# Patient Record
Sex: Male | Born: 1970 | Race: White | Hispanic: No | Marital: Married
Health system: Southern US, Community
[De-identification: ages and names within clinical notes are randomized; demographics above are authoritative.]

---

## 2006-08-12 ENCOUNTER — Ambulatory Visit: Payer: Self-pay

## 2010-05-03 ENCOUNTER — Emergency Department: Payer: Self-pay | Admitting: Internal Medicine

## 2012-06-23 ENCOUNTER — Emergency Department: Payer: Self-pay | Admitting: Emergency Medicine

## 2012-06-23 LAB — BASIC METABOLIC PANEL
Anion Gap: 10 (ref 7–16)
BUN: 10 mg/dL (ref 7–18)
Chloride: 104 mmol/L (ref 98–107)
Creatinine: 0.89 mg/dL (ref 0.60–1.30)
EGFR (Non-African Amer.): >60
Glucose: 238 mg/dL — ABNORMAL HIGH (ref 65–99)
Potassium: 3.5 mmol/L (ref 3.5–5.1)

## 2012-06-23 LAB — URINALYSIS, COMPLETE
Bacteria: NONE SEEN
Glucose,UR: 50 mg/dL (ref 0–75)
Hyaline Cast: 1
Leukocyte Esterase: NEGATIVE
Nitrite: NEGATIVE
Ph: 7 (ref 4.5–8.0)
RBC,UR: 10 /HPF (ref 0–5)
Specific Gravity: 1.008 (ref 1.003–1.030)

## 2012-06-23 LAB — CBC
HCT: 42 % (ref 40.0–52.0)
HGB: 15 g/dL (ref 13.0–18.0)
MCH: 29.9 pg (ref 26.0–34.0)
RBC: 5.03 10*6/uL (ref 4.40–5.90)
WBC: 8.7 10*3/uL (ref 3.8–10.6)

## 2012-07-14 ENCOUNTER — Emergency Department: Payer: Self-pay | Admitting: Emergency Medicine

## 2012-07-14 LAB — COMPREHENSIVE METABOLIC PANEL
Albumin: 4 g/dL (ref 3.4–5.0)
Alkaline Phosphatase: 71 U/L (ref 50–136)
BUN: 14 mg/dL (ref 7–18)
Bilirubin,Total: 0.4 mg/dL (ref 0.2–1.0)
Calcium, Total: 9 mg/dL (ref 8.5–10.1)
Glucose: 138 mg/dL — ABNORMAL HIGH (ref 65–99)
Osmolality: 288 (ref 275–301)
Potassium: 3.3 mmol/L — ABNORMAL LOW (ref 3.5–5.1)
SGOT(AST): 23 U/L (ref 15–37)
Sodium: 143 mmol/L (ref 136–145)

## 2012-07-14 LAB — LIPASE, BLOOD: Lipase: 113 U/L (ref 73–393)

## 2012-07-14 LAB — CBC
HGB: 14.6 g/dL (ref 13.0–18.0)
MCH: 29.2 pg (ref 26.0–34.0)
MCHC: 35.3 g/dL (ref 32.0–36.0)
MCV: 83 fL (ref 80–100)
Platelet: 175 10*3/uL (ref 150–440)
RBC: 4.99 10*6/uL (ref 4.40–5.90)
WBC: 7.3 10*3/uL (ref 3.8–10.6)

## 2012-07-14 LAB — URINALYSIS, COMPLETE
Bacteria: NONE SEEN
Bilirubin,UR: NEGATIVE
Glucose,UR: NEGATIVE mg/dL (ref 0–75)
Leukocyte Esterase: NEGATIVE
Protein: NEGATIVE
RBC,UR: 13 /HPF (ref 0–5)
Specific Gravity: 1.014 (ref 1.003–1.030)
Squamous Epithelial: <1
WBC UR: <1 /HPF (ref 0–5)

## 2014-08-03 ENCOUNTER — Emergency Department: Payer: Self-pay | Admitting: Emergency Medicine

## 2016-11-23 ENCOUNTER — Emergency Department: Payer: Self-pay

## 2016-11-23 ENCOUNTER — Emergency Department
Admission: EM | Admit: 2016-11-23 | Discharge: 2016-11-24 | Payer: Self-pay | Attending: Student in an Organized Health Care Education/Training Program | Admitting: Student in an Organized Health Care Education/Training Program

## 2016-11-23 DIAGNOSIS — J9601 Acute respiratory failure with hypoxia: Secondary | ICD-10-CM | POA: Insufficient documentation

## 2016-11-23 DIAGNOSIS — R8299 Other abnormal findings in urine: Secondary | ICD-10-CM | POA: Insufficient documentation

## 2016-11-23 DIAGNOSIS — I613 Nontraumatic intracerebral hemorrhage in brain stem: Secondary | ICD-10-CM | POA: Insufficient documentation

## 2016-11-23 LAB — URINALYSIS, COMPLETE (UACMP) WITH MICROSCOPIC
BILIRUBIN URINE: NEGATIVE
Glucose, UA: NEGATIVE mg/dL
Hgb urine dipstick: NEGATIVE
Ketones, ur: 5 mg/dL — AB
LEUKOCYTES UA: NEGATIVE
NITRITE: NEGATIVE
Protein, ur: >=300 mg/dL — AB
Specific Gravity, Urine: 1.022 (ref 1.005–1.030)
pH: 5 (ref 5.0–8.0)

## 2016-11-23 LAB — CBC WITH DIFFERENTIAL/PLATELET
BASOS ABS: 0.1 10*3/uL (ref 0–0.1)
BASOS PCT: 0 %
EOS ABS: 0.3 10*3/uL (ref 0–0.7)
Eosinophils Relative: 1 %
HCT: 48.9 % (ref 40.0–52.0)
HEMOGLOBIN: 16.4 g/dL (ref 13.0–18.0)
Lymphocytes Relative: 41 %
Lymphs Abs: 7.5 10*3/uL — ABNORMAL HIGH (ref 1.0–3.6)
MCH: 27.7 pg (ref 26.0–34.0)
MCHC: 33.5 g/dL (ref 32.0–36.0)
MCV: 82.7 fL (ref 80.0–100.0)
Monocytes Absolute: 1.7 10*3/uL — ABNORMAL HIGH (ref 0.2–1.0)
Monocytes Relative: 9 %
NEUTROS PCT: 49 %
Neutro Abs: 8.6 10*3/uL — ABNORMAL HIGH (ref 1.4–6.5)
Platelets: 192 10*3/uL (ref 150–440)
RBC: 5.92 MIL/uL — AB (ref 4.40–5.90)
RDW: 14.7 % — ABNORMAL HIGH (ref 11.5–14.5)
WBC: 18.1 10*3/uL — AB (ref 3.8–10.6)

## 2016-11-23 LAB — LACTIC ACID, PLASMA: Lactic Acid, Venous: 5.6 mmol/L (ref 0.5–1.9)

## 2016-11-23 LAB — GLUCOSE, CAPILLARY: Glucose-Capillary: 261 mg/dL — ABNORMAL HIGH (ref 65–99)

## 2016-11-23 MED ORDER — EPINEPHRINE PF 1 MG/10ML IJ SOSY
PREFILLED_SYRINGE | INTRAMUSCULAR | Status: DC | PRN
  Administered 2016-11-23 (×2): 1 mg via INTRAVENOUS

## 2016-11-23 MED ORDER — DEXTROSE 5 % IV SOLN: 2.0000 g | Freq: Once | INTRAVENOUS | Status: DC

## 2016-11-23 MED ORDER — FENTANYL 2500MCG IN NS 250ML (10MCG/ML) PREMIX INFUSION
40.0000 ug/h | INTRAVENOUS | Status: DC
  Administered 2016-11-23: 40 ug/h via INTRAVENOUS
  Filled 2016-11-23: qty 250

## 2016-11-23 MED ORDER — NOREPINEPHRINE BITARTRATE 1 MG/ML IV SOLN
0.0000 ug/min | Freq: Once | INTRAVENOUS | Status: AC
  Administered 2016-11-23: 25 ug/min via INTRAVENOUS
  Filled 2016-11-23: qty 4

## 2016-11-23 MED ORDER — MANNITOL 25 % IV SOLN
100.0000 g | Freq: Once | INTRAVENOUS | Status: AC
  Administered 2016-11-23: 100 g via INTRAVENOUS
  Filled 2016-11-23: qty 400

## 2016-11-23 MED ORDER — MAGNESIUM SULFATE 2 GM/50ML IV SOLN
INTRAVENOUS | Status: AC
  Administered 2016-11-23: 4 g via INTRAVENOUS
  Filled 2016-11-23: qty 50

## 2016-11-23 MED ORDER — NICARDIPINE HCL IN NACL 20-0.86 MG/200ML-% IV SOLN
0.0000 mg/h | INTRAVENOUS | Status: DC
  Administered 2016-11-23: 7.5 mg/h via INTRAVENOUS
  Administered 2016-11-23: 5 mg/h via INTRAVENOUS
  Filled 2016-11-23: qty 200

## 2016-11-23 MED ORDER — SODIUM BICARBONATE 8.4 % IV SOLN
50.0000 meq | Freq: Once | INTRAVENOUS | Status: AC
  Administered 2016-11-23: 50 meq via INTRAVENOUS

## 2016-11-23 MED ORDER — ACETAMINOPHEN 650 MG RE SUPP
650.0000 mg | Freq: Once | RECTAL | Status: AC
  Administered 2016-11-23: 650 mg via RECTAL

## 2016-11-23 MED ORDER — AMIODARONE IV BOLUS ONLY 150 MG/100ML
INTRAVENOUS | Status: AC
  Filled 2016-11-23: qty 100

## 2016-11-23 MED ORDER — CEFEPIME HCL 2 G IJ SOLR
2.0000 g | Freq: Once | INTRAMUSCULAR | Status: AC
  Administered 2016-11-23: 2 g via INTRAVENOUS
  Filled 2016-11-23: qty 2

## 2016-11-23 MED ORDER — ACETAMINOPHEN 650 MG RE SUPP
RECTAL | Status: AC
  Administered 2016-11-23: 650 mg via RECTAL
  Filled 2016-11-23: qty 1

## 2016-11-23 MED ORDER — MAGNESIUM SULFATE 4 GM/100ML IV SOLN
4.0000 g | Freq: Once | INTRAVENOUS | Status: AC
  Administered 2016-11-23: 4 g via INTRAVENOUS

## 2016-11-23 MED ORDER — SODIUM CHLORIDE 0.9 % IV SOLN
1500.0000 mg | Freq: Once | INTRAVENOUS | Status: AC
  Administered 2016-11-23: 1500 mg via INTRAVENOUS
  Filled 2016-11-23: qty 15

## 2016-11-23 MED ORDER — SODIUM CHLORIDE 0.9 % IV SOLN: 40.0000 ug/h | INTRAVENOUS | Status: DC

## 2016-11-23 MED ORDER — ETOMIDATE 2 MG/ML IV SOLN
20.0000 mg | Freq: Once | INTRAVENOUS | Status: AC
  Administered 2016-11-23: 20 mg via INTRAVENOUS

## 2016-11-23 MED ORDER — PROPOFOL 1000 MG/100ML IV EMUL
10.0000 ug/kg/min | Freq: Once | INTRAVENOUS | Status: DC
  Administered 2016-11-23: 10 ug/kg/min via INTRAVENOUS

## 2016-11-23 MED ORDER — SUCCINYLCHOLINE CHLORIDE 20 MG/ML IJ SOLN
100.0000 mg | Freq: Once | INTRAMUSCULAR | Status: AC
  Administered 2016-11-23: 100 mg via INTRAVENOUS

## 2016-11-23 NOTE — ED Notes (Signed)
CPR started on patient

## 2016-11-23 NOTE — ED Provider Notes (Signed)
[facility] Emergency Department Provider Note    First MD Initiated Contact with Patient 12/02/2016 1851     (approximate)  I have reviewed the triage vital signs and the nursing notes.   HISTORY  Chief Complaint Respiratory Distress  Level V Caveat:  unresponsive  HPI Bernard Patterson is a 46 y.o. male presents via EMS with agonal respirations after falling unresponsive while at work. According to coworkers patient was saying that he felt "sick" few moments prior to having syncopal episode. Found by EMS to be hypertensive to 260s. Pupils were pinpoint. No seizure episode upon initial evaluation. They tried 2 mg of Narcan without any improvement. Patient taken emergently via ambulance to the ER. In route patient did have a generalized tonic-clonic seizure lasting roughly 2 minutes. Treated with Versed. Patient arrives to the ER with agonal respirations. Unresponsive. Hypertensive with palpable pulse.Glucose normal.   No past medical history on file. No family history on file. No past surgical history on file. There are no active problems to display for this patient.     Prior to Admission medications   Not on File    Allergies Patient has no allergy information on record.    Social History Social History  Substance Use Topics  . Smoking status: Not on file  . Smokeless tobacco: Not on file  . Alcohol use Not on file    Review of Systems Unable to obtain due to unresponsiveness ____________________________________________   PHYSICAL EXAM:  VITAL SIGNS: Vitals:   11/27/2016 1948 12/06/2016 2001  BP: (!) 160/91 (!) 199/92  Pulse: 90 (!) 101  Resp: (!) 22 (!) 25  Temp: (!) 101 F (38.3 C) (!) 100.4 F (38 C)    Constitutional: agonal respirations, critically ill, bagging being performed by EMS Eyes: Conjunctivae are injected, pupils. EOMI. Head: Atraumatic. Nose: No congestion/rhinnorhea. Mouth/Throat: Mucous membranes are moist.   Oropharynx non-erythematous. Neck: No stridor. Cardiovascular: tachycardic, regular rhythm. Grossly normal heart sounds.  Good peripheral circulation. Respiratory: agonal respirations with coarse rhonchorus breathsounds Gastrointestinal: Soft and nontender. No distention. No abdominal bruits. No CVA tenderness. Musculoskeletal: No lower extremity tenderness nor edema.  No joint effusions. Neurologic:  GCS 3, .   ____________________________________________   LABS (all labs ordered are listed, but only abnormal results are displayed)  Results for orders placed or performed during the hospital encounter of 12/08/2016 (from the past 24 hour(s))  CBC WITH DIFFERENTIAL     Status: Abnormal   Collection Time: 11/19/2016  6:43 PM  Result Value Ref Range   WBC 18.1 (H) 3.8 - 10.6 K/uL   RBC 5.92 (H) 4.40 - 5.90 MIL/uL   Hemoglobin 16.4 13.0 - 18.0 g/dL   HCT 16.148.9 09.640.0 - 04.552.0 %   MCV 82.7 80.0 - 100.0 fL   MCH 27.7 26.0 - 34.0 pg   MCHC 33.5 32.0 - 36.0 g/dL   RDW 40.914.7 (H) 81.111.5 - 91.414.5 %   Platelets 192 150 - 440 K/uL   Neutrophils Relative % 49 %   Neutro Abs 8.6 (H) 1.4 - 6.5 K/uL   Lymphocytes Relative 41 %   Lymphs Abs 7.5 (H) 1.0 - 3.6 K/uL   Monocytes Relative 9 %   Monocytes Absolute 1.7 (H) 0.2 - 1.0 K/uL   Eosinophils Relative 1 %   Eosinophils Absolute 0.3 0 - 0.7 K/uL   Basophils Relative 0 %   Basophils Absolute 0.1 0 - 0.1 K/uL  Lactic acid, plasma     Status: Abnormal  Collection Time: 12/13/2016  7:31 PM  Result Value Ref Range   Lactic Acid, Venous 5.6 (HH) 0.5 - 1.9 mmol/L  Urinalysis, Complete w Microscopic     Status: Abnormal   Collection Time: 11/26/2016  7:31 PM  Result Value Ref Range   Color, Urine YELLOW (A) YELLOW   APPearance CLEAR (A) CLEAR   Specific Gravity, Urine 1.022 1.005 - 1.030   pH 5.0 5.0 - 8.0   Glucose, UA NEGATIVE NEGATIVE mg/dL   Hgb urine dipstick NEGATIVE NEGATIVE   Bilirubin Urine NEGATIVE NEGATIVE   Ketones, ur 5 (A) NEGATIVE mg/dL    Protein, ur >=811 (A) NEGATIVE mg/dL   Nitrite NEGATIVE NEGATIVE   Leukocytes, UA NEGATIVE NEGATIVE  Glucose, capillary     Status: Abnormal   Collection Time: 11/22/2016  9:13 PM  Result Value Ref Range   Glucose-Capillary 261 (H) 65 - 99 mg/dL   ____________________________________________  EKG My review and personal interpretation at Time: 18:42   Indication: ams  Rate: 130  Rhythm: sinus Axis: normal Other: diffuse inferolateral ST depressions with ST elevation in Avr> V1 with lateral t wave inversions ____________________________________________  RADIOLOGY  I personally reviewed all radiographic images ordered to evaluate for the above acute complaints and reviewed radiology reports and findings.  These findings were personally discussed with the patient.  Please see medical record for radiology report.  ____________________________________________   PROCEDURES  Procedure(s) performed:  Procedure Name: Intubation Date/Time: 12/06/2016 7:38 PM Performed by: Willy Eddy Pre-anesthesia Checklist: Patient identified, Emergency Drugs available and Suction available Oxygen Delivery Method: Simple face mask Preoxygenation: Pre-oxygenation with 100% oxygen Intubation Type: Rapid sequence Ventilation: Two handed mask ventilation required and Nasal airway inserted- appropriate to patient size Laryngoscope Size: Glidescope and 3 Grade View: Grade III Tube size: 7.5 mm Number of attempts: 1 Airway Equipment and Method: Stylet and Video-laryngoscopy Placement Confirmation: ETT inserted through vocal cords under direct vision,  Positive ETCO2,  CO2 detector and Breath sounds checked- equal and bilateral Secured at: 22 cm Tube secured with: ETT holder Dental Injury: Teeth and Oropharynx as per pre-operative assessment  Difficulty Due To: Difficulty was anticipated Future Recommendations: Recommend- induction with short-acting agent, and alternative techniques readily  available         Critical Care performed: yes CRITICAL CARE Performed by: Willy Eddy   Total critical care time: 75 minutes  Critical care time was exclusive of separately billable procedures and treating other patients.  Critical care was necessary to treat or prevent imminent or life-threatening deterioration.  Critical care was time spent personally by me on the following activities: development of treatment plan with patient and/or surrogate as well as nursing, discussions with consultants, evaluation of patient's response to treatment, examination of patient, obtaining history from patient or surrogate, ordering and performing treatments and interventions, ordering and review of laboratory studies, ordering and review of radiographic studies, pulse oximetry and re-evaluation of patient's condition.  ____________________________________________   INITIAL IMPRESSION / ASSESSMENT AND PLAN / ED COURSE  Pertinent labs & imaging results that were available during my care of the patient were reviewed by me and considered in my medical decision making (see chart for details).  DDX: sah, seizure, acs, electrolyte ab  Bernard Patterson is a 46 y.o. who presents to the ED with agonal respirations after a witnessed fall as described above. Patient tachycardic and hypertensive. Intubated due to agonal respirations with a GCS of 3. No purposeful movement. Differentials included seizure, head bleed, ACS. EKG does show  diffuse ST changes concerning for ACS but based on his medical neurologic this time I am concern for head bleed. Patient taken emergently to CT head.   CT imaging with evidence of acute brainstem hemorrhage with surrounding edema to explain the patient's presentation. Patient started on Cardene. We'll continue propofol and fentanyl. We'll start mannitol as well.  Have contacted [facility] for transfer for further evaluation and management. Patient remains in critical  condition. Clinical Course as of Nov 23 2241  Sat Dec 14, 2016  2005 Patient exhibited [facility]. Family updated at bedside. Patient currently stable remains critically ill. Blood pressure now at goal on Cardene and propofol.  IV mannitol infusing.    [PR]  2059 Patient began having seizure-like activity while being transferred to the bed from our [facility]. Patient was given IV Ativan. Patient then went into what appeared to be torsades. Subsequently had started on CPR as he went pulseless. Present defibrillated 1 time. 2 rounds of CPR performed with 1 mg of epi given in between. After that the patient did have return of spontaneous circulation. He is found to be hypotensive which point patient was started on IV magnesium as well as norepinephrine drip. Patient with critical condition and poor prognosis. Family updated.  [PR]  2105 PAtient now hypotensive.  Started on epi gtt.  [PR]  2133 An additional 30 minutes of CPR was performed with refractory PA film to respond to epinephrine magnesium calcium bicarbonate. At this point the patient's status remains critical and due to significant head bleed with evidence of probable catastrophic brain stem herniation. Patient family was updated on his report prognosis and resuscitative efforts were stopped at 9:25pm.  ME was contacted.  All questions were answered to the best of my ability.    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    Willy Eddy, MD December 14, 2016 2256

## 2016-11-23 NOTE — ED Notes (Signed)
Patient taken to CT.

## 2016-11-23 NOTE — ED Notes (Signed)
Family at bedside. 

## 2016-11-23 NOTE — ED Notes (Addendum)
Return of pulse noted with BP of 183/137 and pulse of 108.

## 2016-11-23 NOTE — ED Notes (Signed)
Patient entered v-fib

## 2016-11-23 NOTE — ED Notes (Signed)
[facility] at beside

## 2016-11-23 NOTE — ED Notes (Signed)
Pharmacy called for them to release meds

## 2016-11-23 NOTE — ED Notes (Addendum)
During patient transfer to [facility] bed, he started to jerk and vomit.  Patient was quickly suctioned and OG connected to suction to evacuate him.  Patient bradied down to 46 pulse during episode.  2mg  Ativan IV was given by [facility] RN into patient left forearm IV.  MD notified.

## 2016-11-23 NOTE — ED Notes (Signed)
Patient went back into v-fib and was shocked to correct arrythmia

## 2016-11-23 NOTE — ED Notes (Signed)
Staff at bedside:  Maralyn SagoSarah, RN; Onalee Huaavid, RN;  Waldo LaineKaitlin, RN; Theodoro Gristave, EMT; Imagene GurneyJann, EDT; Roxan Hockeyobinson, MD; Dora SimsSonjia, RN; Gearldine BienenstockBrandy, RN; Dorinda Hillonald, RN; Darl PikesSusan, RN

## 2016-11-23 NOTE — ED Notes (Signed)
Patient returned from CT

## 2016-11-23 NOTE — ED Notes (Signed)
Patient placed in bag with identification on the outside of the bag.

## 2016-11-23 NOTE — ED Notes (Signed)
Last set of [facility] VS:  115/79BP, 95P, 28R, et 43

## 2016-11-23 NOTE — ED Notes (Signed)
[facility] called to come pick up patient.

## 2016-11-23 NOTE — ED Notes (Signed)
Time of Death called

## 2016-11-23 NOTE — ED Notes (Addendum)
EMS arrives with patient and according to them the patient was at work and was reported to go down with co workers seeing him look ike he was clenching his head, EMS [facility].  Patient is non responsive and being bagged as of arrival to room 6.  EMS states they administered 4 mg Narcan to patient en route to [facility].

## 2016-11-23 NOTE — ED Notes (Signed)
Patient shocked by [facility] RN, citing Torsade rhythm

## 2016-11-25 LAB — URINE CULTURE: CULTURE: NO GROWTH

## 2016-11-28 LAB — CULTURE, BLOOD (ROUTINE X 2)
CULTURE: NO GROWTH
Culture: NO GROWTH
SPECIAL REQUESTS: ADEQUATE

## 2016-12-15 NOTE — ED Notes (Signed)
During intubation after tube was placed, a "piece french fry " was reported to be seen coming out of the tube by the placing MD.

## 2016-12-15 NOTE — ED Notes (Signed)
RT called to room in anticipated arrival of Respiratory Distress patient.

## 2016-12-15 DEATH — deceased

## 2018-11-21 IMAGING — CT CT HEAD W/O CM
3 series · 14 of 47 positions shown, 16 images · non-contrast
Comparison: None.

CLINICAL DATA: Patient found unresponsive.

EXAM:
CT HEAD WITHOUT CONTRAST
TECHNIQUE: Contiguous axial images were obtained from the base of the skull
through the vertex without intravenous contrast.

[Series 2: head wo · axial · 0.47mm/px · z∈[-163,-13]mm · 8 of 36 slices shown, 10 images]
[im 3/36  brain]
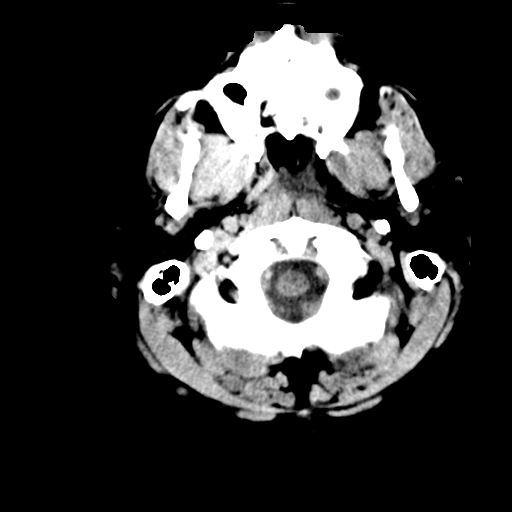
[im 3/36  bone]
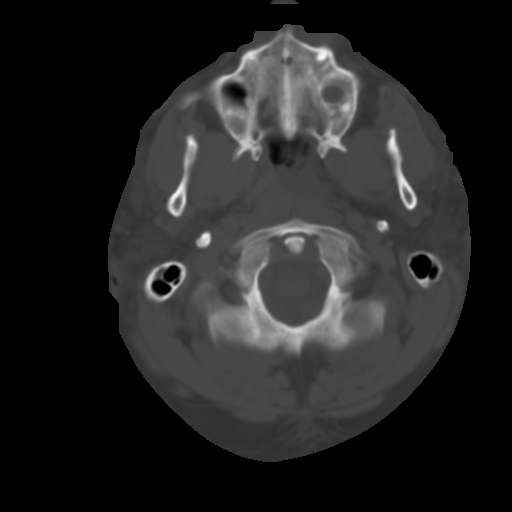
[im 8/36  brain]
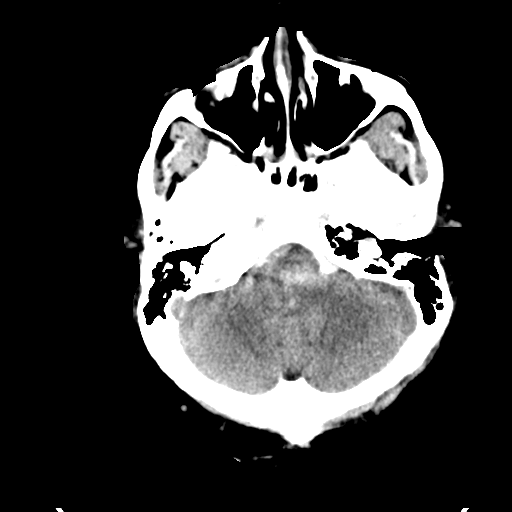
[im 11/36  brain]
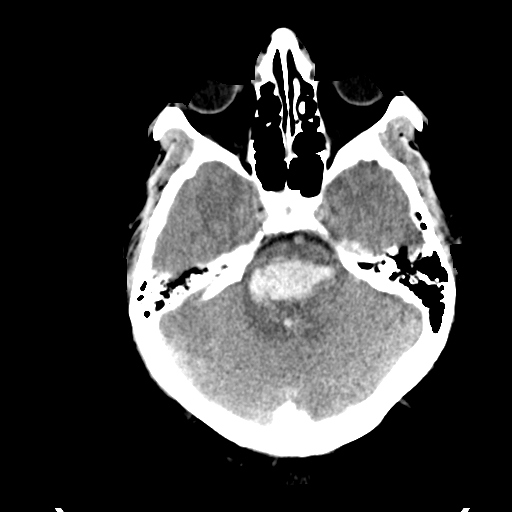
[im 16/36  brain]
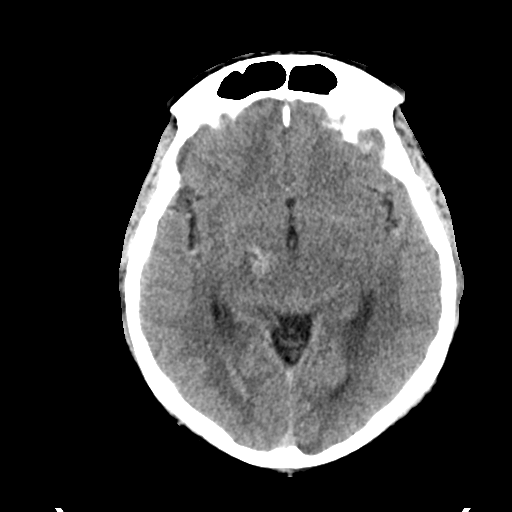
[im 20/36  brain]
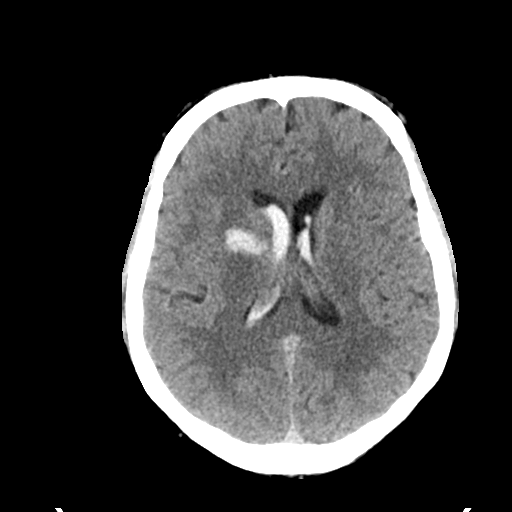
[im 20/36  bone]
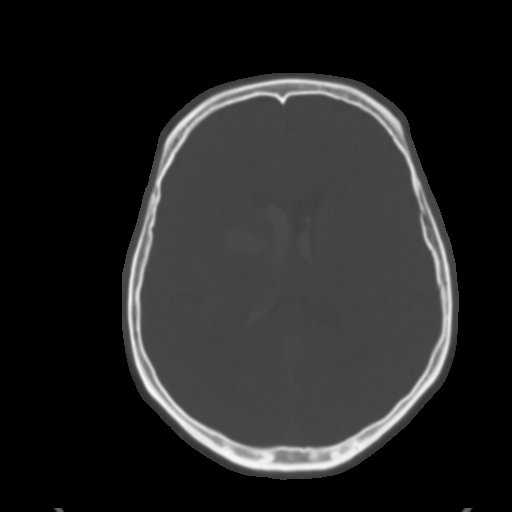
[im 25/36  brain]
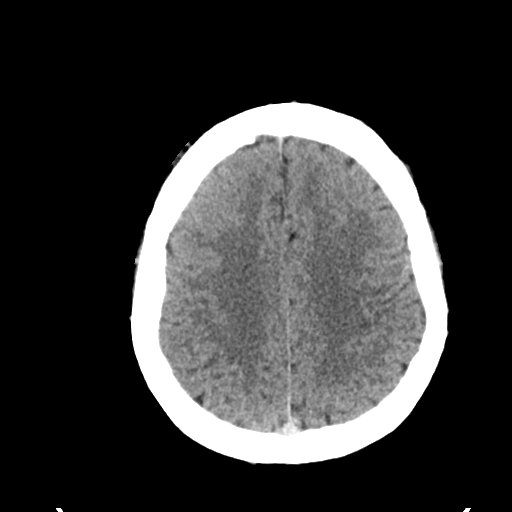
[im 28/36  brain]
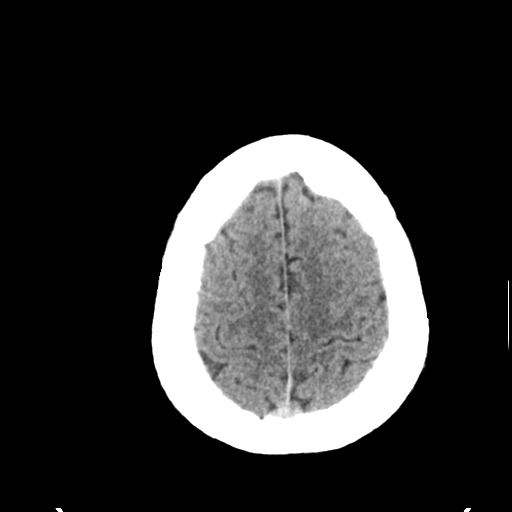
[im 33/36  brain]
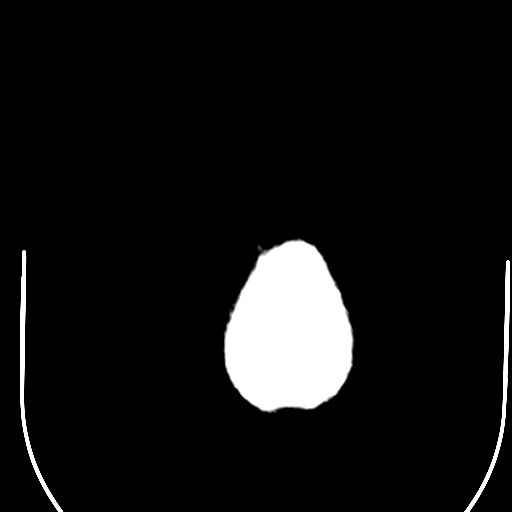

[Series 4: coronal soft tissue · coronal · 0.31mm/px · 3 of 69 slices shown]
[im 25/69  brain]
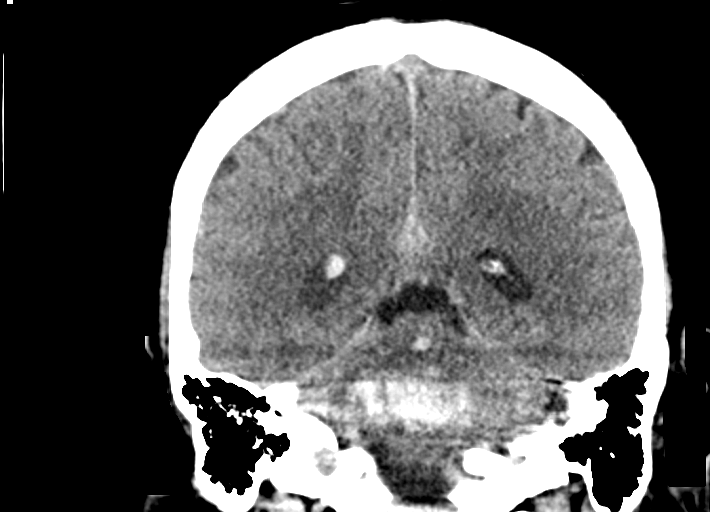
[im 31/69  brain]
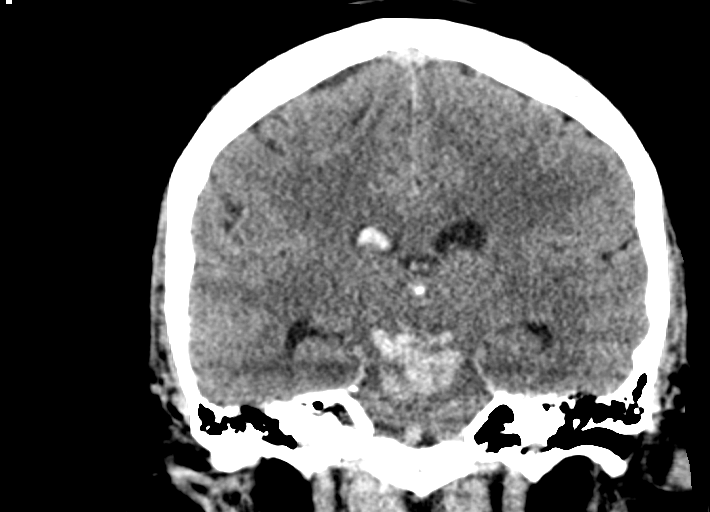
[im 38/69  brain]
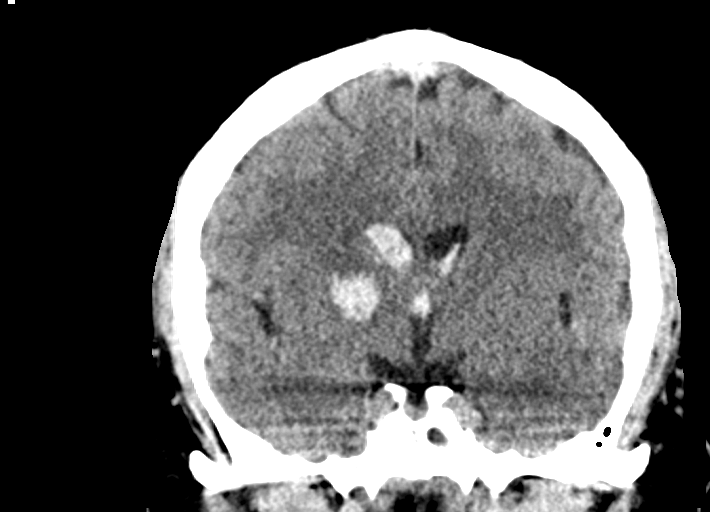

[Series 5: sagittal soft tissue · sagittal · 0.32mm/px · 3 of 58 slices shown]
[im 20/58  brain]
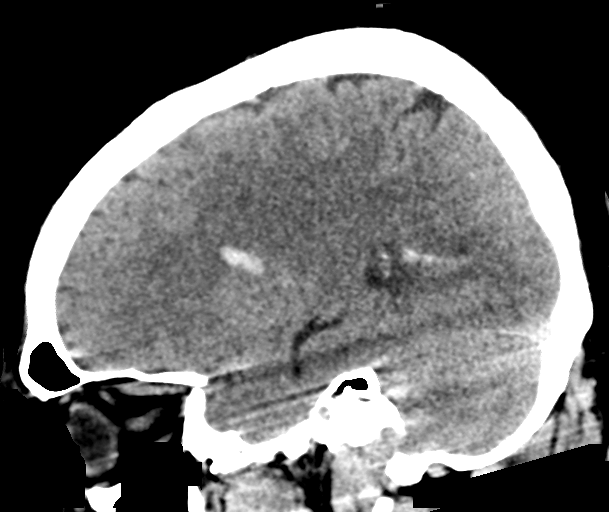
[im 29/58  brain]
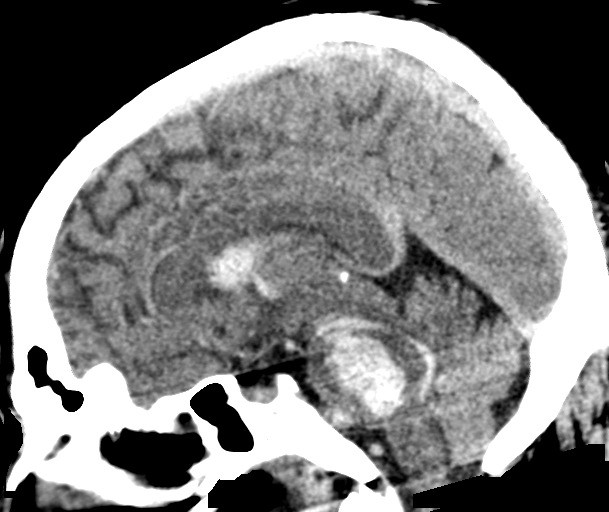
[im 39/58  brain]
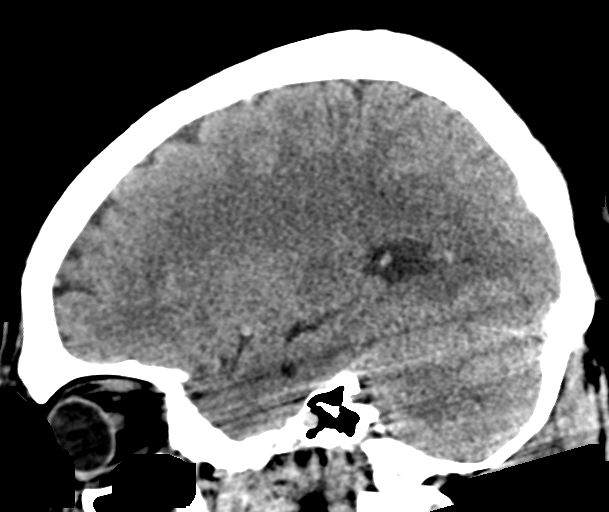

[14 of 47 positions shown; findings below may reference images not displayed]

FINDINGS: Brain: Acute hemorrhage is identified in the brainstem, and tracking
up the right peduncle into the basal ganglia on the right. There is
dissection of blood products into the lateral ventricles
bilaterally, right greater than left. Mass-effect from the brainstem
hemorrhage is identified on the fourth ventricle and in the basilar
cisterns. No evidence for hydrocephalus at this time. No midline
shift.

Vascular: No hyperdense vessel or unexpected calcification.

Skull: No evidence for fracture. No worrisome lytic or sclerotic
lesion.

Sinuses/Orbits: Fluid levels are seen in the sphenoid sinuses
bilaterally, potentially from intubation. Mucosal thickening noted
left maxillary sinus. Visualized portions of the globes and
intraorbital fat are unremarkable.

Other: None.
IMPRESSION: 1. Brainstem hemorrhage tracking cranially into the right basal
ganglia and lateral ventricles, right greater than left. Mass-effect
in the brainstem causes effacement the ambient cisterns with
mass-effect identified in the quadrigeminal cisterns. Blood products
also identified in the attenuated fourth ventricle. Hypertensive
hemorrhage would be a consideration.
Critical Value/emergent results were called by me at the time of
interpretation on 11/23/2016 at [DATE] to Dr. CUBILLAS HECHT , who
verbally acknowledged these results.

## 2018-11-21 IMAGING — DX DG CHEST 1V PORT
1 series · 1 of 1 positions shown · non-contrast
Comparison: None.

CLINICAL DATA: Status post intubation.

EXAM:
PORTABLE CHEST 1 VIEW

[chest ap]
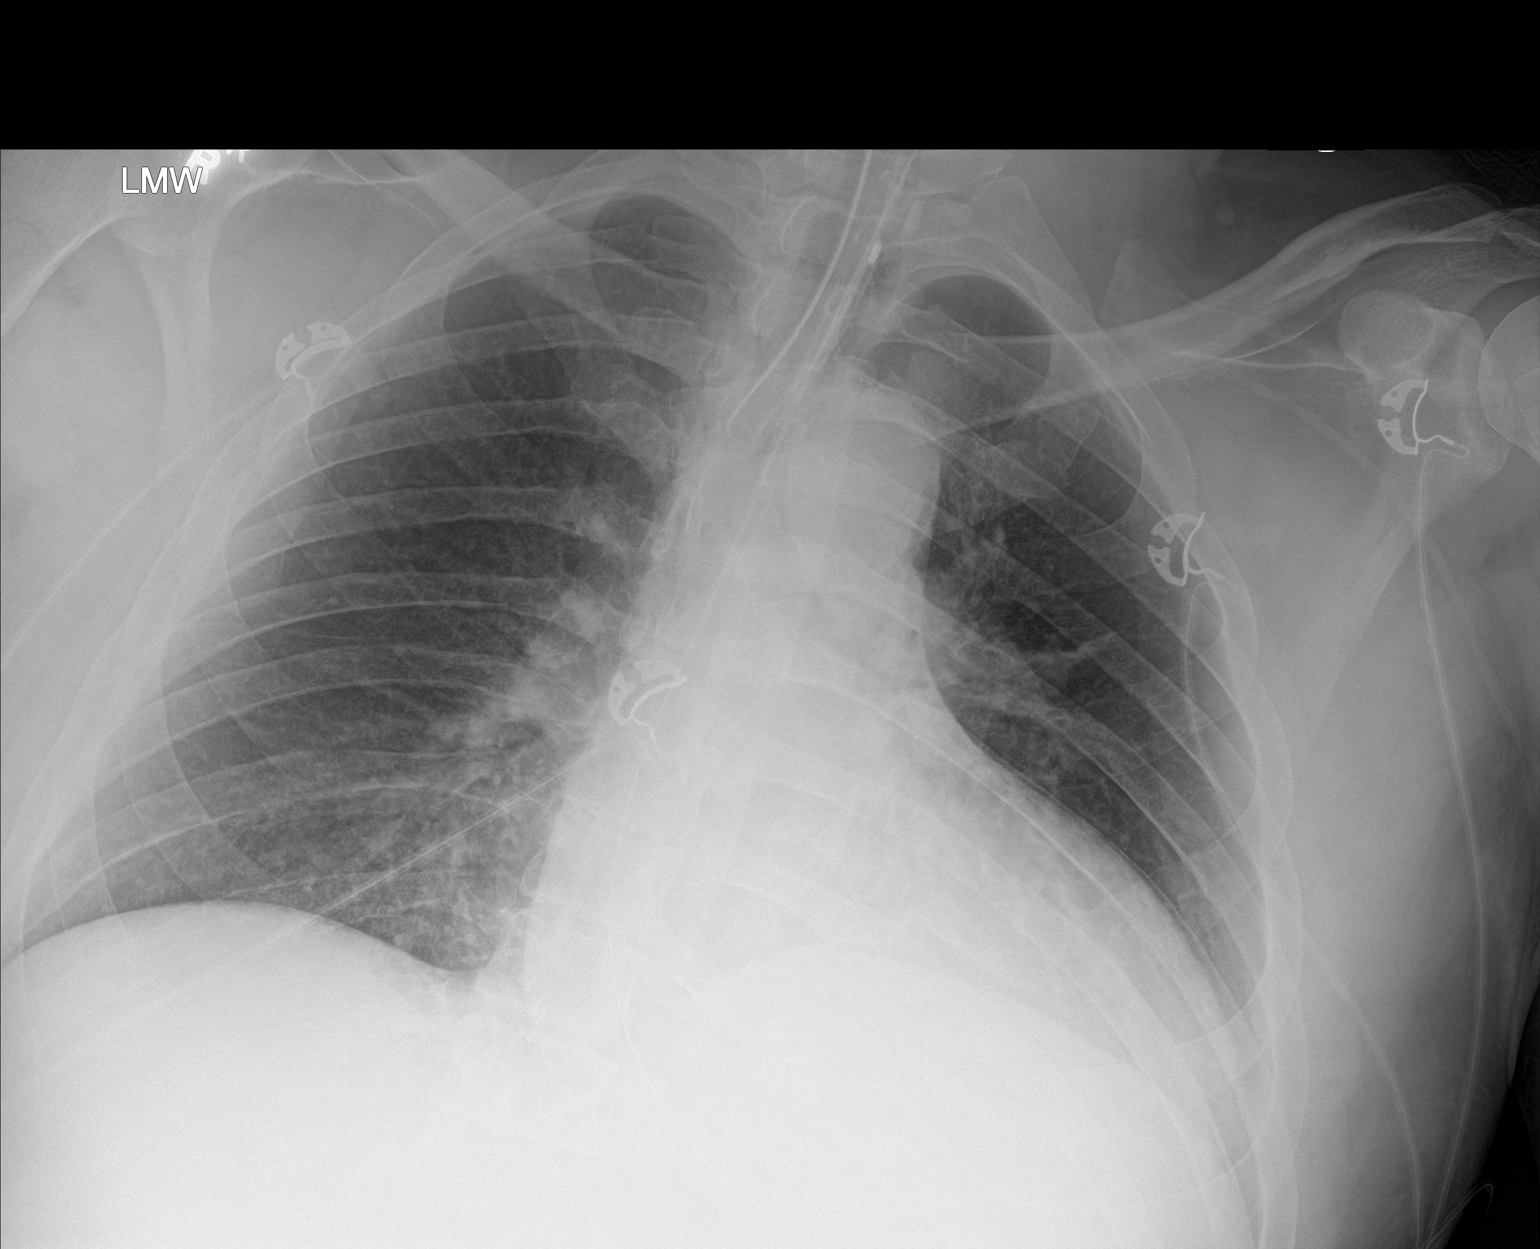

[1 of 1 positions shown; findings below may reference images not displayed]

FINDINGS: The tip of an endotracheal tube is 3.1 cm above the carina. Heart is
borderline enlarged. Gastric tube extends into the expected location
the stomach. There is aortic atherosclerosis without aneurysm. No
pneumonic consolidation, effusion or pneumothorax. The right
costophrenic angle is excluded on this study. No acute nor
suspicious osseous abnormalities.
IMPRESSION: 1. Satisfactory endotracheal tube position.
2. A gastric tube is seen extending below left hemidiaphragm into
the expected location the stomach. The tip is not included however.
3. No active pulmonary disease.
